# Patient Record
Sex: Female | Born: 2000 | Race: White | Hispanic: No | Marital: Single | Smoking: Never smoker
Health system: Southern US, Community
[De-identification: ages and names within clinical notes are randomized; demographics above are authoritative.]

## PROBLEM LIST (undated history)

## (undated) DIAGNOSIS — F32A Depression, unspecified: Secondary | ICD-10-CM

## (undated) HISTORY — PX: HERNIA REPAIR: SHX51

---
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
## 2023-05-07 HISTORY — PX: BREAST REDUCTION SURGERY: SHX8

## 2023-07-03 ENCOUNTER — Other Ambulatory Visit: Payer: Self-pay

## 2023-07-03 ENCOUNTER — Other Ambulatory Visit: Payer: Self-pay | Admitting: Orthopaedic Surgery

## 2023-07-03 ENCOUNTER — Encounter (HOSPITAL_BASED_OUTPATIENT_CLINIC_OR_DEPARTMENT_OTHER): Payer: Self-pay | Admitting: Orthopaedic Surgery

## 2023-07-03 DIAGNOSIS — S8261XA Displaced fracture of lateral malleolus of right fibula, initial encounter for closed fracture: Secondary | ICD-10-CM

## 2023-07-04 ENCOUNTER — Ambulatory Visit
Admission: RE | Admit: 2023-07-04 | Discharge: 2023-07-04 | Disposition: A | Discharge status: Home / Self Care | Payer: BC Managed Care – PPO | Source: Ambulatory Visit | Attending: Orthopaedic Surgery | Admitting: Orthopaedic Surgery

## 2023-07-04 DIAGNOSIS — S8261XA Displaced fracture of lateral malleolus of right fibula, initial encounter for closed fracture: Secondary | ICD-10-CM

## 2023-07-06 NOTE — H&P (Signed)
 ORTHOPAEDIC SURGERY H&P  Subjective:  The patient presents with right ankle fx.   Past Medical History:  Diagnosis Date   Depression     Past Surgical History:  Procedure Laterality Date   BREAST REDUCTION SURGERY Bilateral 05/07/2023   HERNIA REPAIR       (Not in an outpatient encounter)    Allergies  Allergen Reactions   Penicillins Rash    Social History   Socioeconomic History   Marital status: Single    Spouse name: Not on file   Number of children: Not on file   Years of education: Not on file   Highest education level: Not on file  Occupational History   Not on file  Tobacco Use   Smoking status: Never   Smokeless tobacco: Never  Vaping Use   Vaping status: Never Used  Substance and Sexual Activity   Alcohol use: Never   Drug use: Never   Sexual activity: Not on file  Other Topics Concern   Not on file  Social History Narrative   Not on file   Social Drivers of Health   Financial Resource Strain: Low Risk  (02/22/2020)   Received from [facility]   Overall Financial Resource Strain (CARDIA)    Difficulty of Paying Living Expenses: Not hard at all  Food Insecurity: Low Risk  (02/17/2023)   Received from [facility]   Hunger Vital Sign    Worried About Running Out of Food in the Last Year: Never true    Ran Out of Food in the Last Year: Never true  Transportation Needs: No Transportation Needs (02/17/2023)   Received from [facility]    In the past 12 months, has lack of reliable transportation kept you from medical appointments, meetings, work or from getting things needed for daily living? : No  Physical Activity: Inactive (02/22/2020)   Received from [facility]   Exercise Vital Sign    Days of Exercise per Week: 0 days    Minutes of Exercise per Session: 0 min  Stress: Stress Concern Present (02/22/2020)   Received from [facility] - Occupational Stress Questionnaire     Feeling of Stress : Very much  Social Connections: Unknown (02/22/2020)   Received from [facility]   Social Connection and Isolation Panel [NHANES]    Frequency of Communication with Friends and Family: More than three times a week    Frequency of Social Gatherings with Friends and Family: More than three times a week    Attends Religious Services: Not on file    Active Member of Clubs or Organizations: Not on file    Attends Banker Meetings: Not on file    Marital Status: Never married  Intimate Partner Violence: Not At Risk (07/01/2023)   Received from [facility]   HITS    Over the last 12 months how often did your partner physically hurt you?: Never    Over the last 12 months how often did your partner insult you or talk down to you?: Never    Over the last 12 months how often did your partner threaten you with physical harm?: Never    Over the last 12 months how often did your partner scream or curse at you?: Never     History reviewed. No pertinent family history.   Review of Systems Pertinent items are noted in HPI.  Objective: Vital signs in last 24 hours:  07/03/2023    3:18 PM 06/10/2019   11:50 PM  Vitals with BMI  Height 5\' 3"    Weight 135 lbs 135 lbs  BMI 23.92   Systolic  124  Diastolic  61  Pulse  97      EXAM: General: Well nourished, well developed. Awake, alert and oriented to time, place, person. Normal mood and affect. No apparent distress. Breathing room air.  Operative Lower Extremity: Alignment - Neutral Deformity - None Skin intact Tenderness to palpation - right ankle 5/5 TA, PT, GS, Per, EHL, FHL Sensation intact to light touch throughout Palpable DP and PT pulses Special testing: None  The contralateral foot/ankle was examined for comparison and noted to be neurovascularly intact with no localized deformity, swelling, or tenderness.  Imaging Review All images taken were independently reviewed by  me.  Assessment/Plan: The clinical and radiographic findings were reviewed and discussed at length with the patient.  The patient has right ankle fx.  We spoke at length about the natural course of these findings. We discussed nonoperative and operative treatment options in detail.  The risks and benefits were presented and reviewed. The risks due to hardware failure/irritation, new/persistent/recurrent infection, stiffness, nerve/vessel/tendon injury, nonunion/malunion of any fracture, wound healing issues, allograft usage, development of arthritis, failure of this surgery, possibility of external fixation in certain situations, possibility of delayed definitive surgery, need for further surgery, prolonged wound care including further soft tissue coverage procedures, thromboembolic events, anesthesia/medical complications/events perioperatively and beyond, amputation, death among others were discussed. The patient acknowledged the explanation and agreed to proceed with the plan.  Netta Cedars  Orthopaedic Surgery [facility]

## 2023-07-06 NOTE — Discharge Instructions (Signed)
 Netta Cedars, MD [facility]  Please read the following information regarding your care after surgery.  Medications  You only need a prescription for the narcotic pain medicine (ex. oxycodone, Percocet, Norco).  All of the other medicines listed below are available over the counter. ? Aleve 2 pills twice a day for the first 3 days after surgery. ? acetominophen (Tylenol) 650 mg every 4-6 hours as you need for minor to moderate pain ? oxycodone as prescribed for severe pain  ? To help prevent blood clots, take aspirin (81 mg) twice daily for at least 42 days after surgery.  You should also get up every hour while you are awake to move around.  Weight Bearing ? Do NOT bear any weight on the operated leg or foot. This means do NOT touch your surgical leg to the ground!  Cast / Splint / Dressing ? If you have a splint, do NOT remove this. Keep your splint, cast or dressing clean and dry.  Don't put anything (coat hanger, pencil, etc) down inside of it.  If it gets wet, call the office immediately to schedule an appointment for a cast change.  Swelling IMPORTANT: It is normal for you to have swelling where you had surgery. To reduce swelling and pain, keep at least 3 pillows under your leg so that your toes are above your nose and your heel is above the level of your hip.  It may be necessary to keep your foot or leg elevated for several weeks.  This is critical to helping your incisions heal and your pain to feel better.  Follow Up Call my office at [phone] when you are discharged from the hospital or surgery center to schedule an appointment to be seen 7-10 days after surgery.  Call my office at [phone] if you develop a fever >101.5 F, nausea, vomiting, bleeding from the surgical site or severe pain.      Post Anesthesia Home Care Instructions  Activity: Get plenty of rest for the remainder of the day. A responsible individual must stay with you for 24 hours following the  procedure.  For the next 24 hours, DO NOT: -Drive a car -Advertising copywriter -Drink alcoholic beverages -Take any medication unless instructed by your physician -Make any legal decisions or sign important papers.  Meals: Start with liquid foods such as gelatin or soup. Progress to regular foods as tolerated. Avoid greasy, spicy, heavy foods. If nausea and/or vomiting occur, drink only clear liquids until the nausea and/or vomiting subsides. Call your physician if vomiting continues.  Special Instructions/Symptoms: Your throat may feel dry or sore from the anesthesia or the breathing tube placed in your throat during surgery. If this causes discomfort, gargle with warm salt water. The discomfort should disappear within 24 hours.  If you had a scopolamine patch placed behind your ear for the management of post- operative nausea and/or vomiting:  1. The medication in the patch is effective for 72 hours, after which it should be removed.  Wrap patch in a tissue and discard in the trash. Wash hands thoroughly with soap and water. 2. You may remove the patch earlier than 72 hours if you experience unpleasant side effects which may include dry mouth, dizziness or visual disturbances. 3. Avoid touching the patch. Wash your hands with soap and water after contact with the patch.     Regional Anesthesia Blocks  1. You may not be able to move or feel the "blocked" extremity after a regional anesthetic block. This  may last may last from 3-48 hours after placement, but it will go away. The length of time depends on the medication injected and your individual response to the medication. As the nerves start to wake up, you may experience tingling as the movement and feeling returns to your extremity. If the numbness and inability to move your extremity has not gone away after 48 hours, please call your surgeon.   2. The extremity that is blocked will need to be protected until the numbness is gone and the  strength has returned. Because you cannot feel it, you will need to take extra care to avoid injury. Because it may be weak, you may have difficulty moving it or using it. You may not know what position it is in without looking at it while the block is in effect.  3. For blocks in the legs and feet, returning to weight bearing and walking needs to be done carefully. You will need to wait until the numbness is entirely gone and the strength has returned. You should be able to move your leg and foot normally before you try and bear weight or walk. You will need someone to be with you when you first try to ensure you do not fall and possibly risk injury.  4. Bruising and tenderness at the needle site are common side effects and will resolve in a few days.  5. Persistent numbness or new problems with movement should be communicated to the surgeon or the [facility] [phone] [facility] [phone]).  No tylenol until after 2:15pm if needed.

## 2023-07-09 ENCOUNTER — Ambulatory Visit (HOSPITAL_COMMUNITY)

## 2023-07-09 ENCOUNTER — Encounter (HOSPITAL_BASED_OUTPATIENT_CLINIC_OR_DEPARTMENT_OTHER)
Admission: RE | Disposition: A | Discharge status: Home / Self Care | Payer: Self-pay | Source: Home / Self Care | Attending: Orthopaedic Surgery

## 2023-07-09 ENCOUNTER — Ambulatory Visit (HOSPITAL_BASED_OUTPATIENT_CLINIC_OR_DEPARTMENT_OTHER)
Admission: RE | Admit: 2023-07-09 | Discharge: 2023-07-09 | Disposition: A | Discharge status: Home / Self Care | Payer: BC Managed Care – PPO | Attending: Orthopaedic Surgery | Admitting: Orthopaedic Surgery

## 2023-07-09 ENCOUNTER — Ambulatory Visit (HOSPITAL_BASED_OUTPATIENT_CLINIC_OR_DEPARTMENT_OTHER): Admitting: Anesthesiology

## 2023-07-09 ENCOUNTER — Other Ambulatory Visit: Payer: Self-pay

## 2023-07-09 ENCOUNTER — Encounter (HOSPITAL_BASED_OUTPATIENT_CLINIC_OR_DEPARTMENT_OTHER): Payer: Self-pay | Admitting: Orthopaedic Surgery

## 2023-07-09 DIAGNOSIS — F32A Depression, unspecified: Secondary | ICD-10-CM | POA: Insufficient documentation

## 2023-07-09 DIAGNOSIS — S82851A Displaced trimalleolar fracture of right lower leg, initial encounter for closed fracture: Secondary | ICD-10-CM | POA: Diagnosis present

## 2023-07-09 DIAGNOSIS — S93431A Sprain of tibiofibular ligament of right ankle, initial encounter: Secondary | ICD-10-CM | POA: Diagnosis not present

## 2023-07-09 DIAGNOSIS — X58XXXA Exposure to other specified factors, initial encounter: Secondary | ICD-10-CM | POA: Insufficient documentation

## 2023-07-09 DIAGNOSIS — Z01818 Encounter for other preprocedural examination: Secondary | ICD-10-CM

## 2023-07-09 HISTORY — PX: SYNDESMOSIS REPAIR: SHX5182

## 2023-07-09 HISTORY — DX: Depression, unspecified: F32.A

## 2023-07-09 HISTORY — PX: ORIF ANKLE FRACTURE: SHX5408

## 2023-07-09 LAB — POCT PREGNANCY, URINE: Preg Test, Ur: NEGATIVE

## 2023-07-09 SURGERY — OPEN REDUCTION INTERNAL FIXATION (ORIF) ANKLE FRACTURE
Anesthesia: Regional | Site: Ankle | Laterality: Right

## 2023-07-09 MED ORDER — FENTANYL CITRATE (PF) 100 MCG/2ML IJ SOLN
INTRAMUSCULAR | Status: AC
  Filled 2023-07-09: qty 2

## 2023-07-09 MED ORDER — VANCOMYCIN HCL 500 MG IV SOLR
INTRAVENOUS | Status: AC
  Filled 2023-07-09: qty 40

## 2023-07-09 MED ORDER — MIDAZOLAM HCL 2 MG/2ML IJ SOLN
INTRAMUSCULAR | Status: AC
  Filled 2023-07-09: qty 2

## 2023-07-09 MED ORDER — 0.9 % SODIUM CHLORIDE (POUR BTL) OPTIME
TOPICAL | Status: DC | PRN
  Administered 2023-07-09: 500 mL

## 2023-07-09 MED ORDER — AMISULPRIDE (ANTIEMETIC) 5 MG/2ML IV SOLN
INTRAVENOUS | Status: AC
  Filled 2023-07-09: qty 4

## 2023-07-09 MED ORDER — DEXAMETHASONE SODIUM PHOSPHATE 10 MG/ML IJ SOLN
INTRAMUSCULAR | Status: AC
  Filled 2023-07-09: qty 1

## 2023-07-09 MED ORDER — DEXAMETHASONE SODIUM PHOSPHATE 10 MG/ML IJ SOLN
INTRAMUSCULAR | Status: DC | PRN
  Administered 2023-07-09: 10 mg via INTRAVENOUS

## 2023-07-09 MED ORDER — OXYCODONE HCL 5 MG PO TABS: 5.0000 mg | ORAL_TABLET | Freq: Once | ORAL | Status: DC | PRN

## 2023-07-09 MED ORDER — PROPOFOL 10 MG/ML IV BOLUS
INTRAVENOUS | Status: DC | PRN
  Administered 2023-07-09: 300 mg via INTRAVENOUS

## 2023-07-09 MED ORDER — FENTANYL CITRATE (PF) 100 MCG/2ML IJ SOLN: 25.0000 ug | INTRAMUSCULAR | Status: DC | PRN

## 2023-07-09 MED ORDER — ONDANSETRON HCL 4 MG/2ML IJ SOLN
INTRAMUSCULAR | Status: DC | PRN
  Administered 2023-07-09: 4 mg via INTRAVENOUS

## 2023-07-09 MED ORDER — OXYCODONE HCL 5 MG/5ML PO SOLN: 5.0000 mg | Freq: Once | ORAL | Status: DC | PRN

## 2023-07-09 MED ORDER — LIDOCAINE 2% (20 MG/ML) 5 ML SYRINGE
INTRAMUSCULAR | Status: AC
  Filled 2023-07-09: qty 5

## 2023-07-09 MED ORDER — POVIDONE-IODINE 10 % EX SOLN
CUTANEOUS | Status: DC | PRN
  Administered 2023-07-09: 1 via TOPICAL

## 2023-07-09 MED ORDER — LACTATED RINGERS IV SOLN: INTRAVENOUS | Status: DC

## 2023-07-09 MED ORDER — AMISULPRIDE (ANTIEMETIC) 5 MG/2ML IV SOLN
10.0000 mg | Freq: Once | INTRAVENOUS | Status: AC | PRN
  Administered 2023-07-09: 10 mg via INTRAVENOUS

## 2023-07-09 MED ORDER — ACETAMINOPHEN 500 MG PO TABS
ORAL_TABLET | ORAL | Status: AC
  Filled 2023-07-09: qty 2

## 2023-07-09 MED ORDER — MIDAZOLAM HCL 5 MG/5ML IJ SOLN
INTRAMUSCULAR | Status: DC | PRN
  Administered 2023-07-09: 2 mg via INTRAVENOUS

## 2023-07-09 MED ORDER — CEFAZOLIN SODIUM-DEXTROSE 2-4 GM/100ML-% IV SOLN
INTRAVENOUS | Status: AC
  Filled 2023-07-09: qty 100

## 2023-07-09 MED ORDER — DEXAMETHASONE SODIUM PHOSPHATE 10 MG/ML IJ SOLN
INTRAMUSCULAR | Status: DC | PRN
  Administered 2023-07-09 (×2): 5 mg

## 2023-07-09 MED ORDER — ROPIVACAINE HCL 5 MG/ML IJ SOLN
INTRAMUSCULAR | Status: DC | PRN
  Administered 2023-07-09: 27 mL via PERINEURAL
  Administered 2023-07-09: 10 mL via PERINEURAL

## 2023-07-09 MED ORDER — VANCOMYCIN HCL 500 MG IV SOLR
INTRAVENOUS | Status: DC | PRN
  Administered 2023-07-09: 500 mg via TOPICAL

## 2023-07-09 MED ORDER — CHLORHEXIDINE GLUCONATE 4 % EX SOLN: 60.0000 mL | Freq: Once | CUTANEOUS | Status: DC

## 2023-07-09 MED ORDER — ONDANSETRON HCL 4 MG/2ML IJ SOLN
INTRAMUSCULAR | Status: AC
  Filled 2023-07-09: qty 2

## 2023-07-09 MED ORDER — FENTANYL CITRATE (PF) 100 MCG/2ML IJ SOLN
100.0000 ug | Freq: Once | INTRAMUSCULAR | Status: AC
  Administered 2023-07-09: 100 ug via INTRAVENOUS

## 2023-07-09 MED ORDER — ACETAMINOPHEN 500 MG PO TABS
1000.0000 mg | ORAL_TABLET | Freq: Once | ORAL | Status: AC
  Administered 2023-07-09: 1000 mg via ORAL

## 2023-07-09 MED ORDER — PROPOFOL 10 MG/ML IV BOLUS
INTRAVENOUS | Status: AC
  Filled 2023-07-09: qty 20

## 2023-07-09 MED ORDER — MIDAZOLAM HCL 2 MG/2ML IJ SOLN
2.0000 mg | Freq: Once | INTRAMUSCULAR | Status: AC
  Administered 2023-07-09: 2 mg via INTRAVENOUS

## 2023-07-09 MED ORDER — FENTANYL CITRATE (PF) 100 MCG/2ML IJ SOLN
INTRAMUSCULAR | Status: DC | PRN
  Administered 2023-07-09 (×2): 25 ug via INTRAVENOUS
  Administered 2023-07-09: 50 ug via INTRAVENOUS

## 2023-07-09 MED ORDER — LIDOCAINE 2% (20 MG/ML) 5 ML SYRINGE
INTRAMUSCULAR | Status: DC | PRN
  Administered 2023-07-09: 20 mg via INTRAVENOUS

## 2023-07-09 MED ORDER — BUPIVACAINE HCL (PF) 0.25 % IJ SOLN
INTRAMUSCULAR | Status: AC
  Filled 2023-07-09: qty 30

## 2023-07-09 MED ORDER — CEFAZOLIN SODIUM-DEXTROSE 2-4 GM/100ML-% IV SOLN
2.0000 g | INTRAVENOUS | Status: AC
  Administered 2023-07-09: 2 g via INTRAVENOUS

## 2023-07-09 SURGICAL SUPPLY — 68 items
BANDAGE ESMARK 6X9 LF (GAUZE/BANDAGES/DRESSINGS) IMPLANT
BIT DRILL 2.5X2.75 QC CALB (BIT) IMPLANT
BIT DRILL 2.9X70 QC CALB (BIT) IMPLANT
BLADE SURG 15 STRL LF DISP TIS (BLADE) ×12 IMPLANT
BNDG COHESIVE 4X5 TAN STRL LF (GAUZE/BANDAGES/DRESSINGS) ×3 IMPLANT
BNDG ELASTIC 6X10 VLCR STRL LF (GAUZE/BANDAGES/DRESSINGS) ×3 IMPLANT
BNDG ESMARK 6X9 LF (GAUZE/BANDAGES/DRESSINGS) IMPLANT
BNDG GAUZE DERMACEA FLUFF 4 (GAUZE/BANDAGES/DRESSINGS) ×3 IMPLANT
BRUSH SCRUB EZ 4% CHG (MISCELLANEOUS) ×3 IMPLANT
CANISTER SUCT 1200ML W/VALVE (MISCELLANEOUS) ×3 IMPLANT
CHLORAPREP W/TINT 26 (MISCELLANEOUS) ×3 IMPLANT
COVER BACK TABLE 60X90IN (DRAPES) ×3 IMPLANT
CUFF TRNQT CYL 34X4.125X (TOURNIQUET CUFF) ×3 IMPLANT
DRAPE C-ARM 42X72 X-RAY (DRAPES) ×3 IMPLANT
DRAPE C-ARMOR (DRAPES) ×3 IMPLANT
DRAPE EXTREMITY T 121X128X90 (DISPOSABLE) ×3 IMPLANT
DRAPE IMP U-DRAPE 54X76 (DRAPES) ×3 IMPLANT
DRAPE U-SHAPE 47X51 STRL (DRAPES) ×3 IMPLANT
DRSG MEPITEL 4X7.2 (GAUZE/BANDAGES/DRESSINGS) ×3 IMPLANT
ELECT REM PT RETURN 9FT ADLT (ELECTROSURGICAL) ×2 IMPLANT
ELECTRODE REM PT RTRN 9FT ADLT (ELECTROSURGICAL) ×3 IMPLANT
FIXATION ZIPTIGHT ANKLE SNDSMS (Ankle) IMPLANT
GAUZE PAD ABD 8X10 STRL (GAUZE/BANDAGES/DRESSINGS) ×9 IMPLANT
GAUZE SPONGE 4X4 12PLY STRL (GAUZE/BANDAGES/DRESSINGS) ×3 IMPLANT
GLOVE BIOGEL PI IND STRL 8 (GLOVE) ×3 IMPLANT
GLOVE SURG SS PI 7.5 STRL IVOR (GLOVE) ×6 IMPLANT
GOWN STRL REUS W/ TWL LRG LVL3 (GOWN DISPOSABLE) ×6 IMPLANT
K-WIRE ACE 1.6X6 (WIRE) ×6 IMPLANT
KWIRE ACE 1.6X6 (WIRE) IMPLANT
MARKER SKIN DUAL TIP RULER LAB (MISCELLANEOUS) IMPLANT
NDL HYPO 25X1 1.5 SAFETY (NEEDLE) IMPLANT
NEEDLE HYPO 25X1 1.5 SAFETY (NEEDLE) IMPLANT
NS IRRIG 1000ML POUR BTL (IV SOLUTION) ×3 IMPLANT
PACK BASIN DAY SURGERY FS (CUSTOM PROCEDURE TRAY) ×3 IMPLANT
PAD CAST 4YDX4 CTTN HI CHSV (CAST SUPPLIES) ×3 IMPLANT
PADDING CAST ABS COTTON 4X4 ST (CAST SUPPLIES) IMPLANT
PADDING CAST SYNTHETIC 4X4 STR (CAST SUPPLIES) ×6 IMPLANT
PADDING CAST SYNTHETIC 6X4 NS (CAST SUPPLIES) ×6 IMPLANT
PENCIL SMOKE EVACUATOR (MISCELLANEOUS) ×3 IMPLANT
PLATE LOCK 7H 92 BILAT FIB (Plate) IMPLANT
SCREW LOCK 3.5X12 DIST TIB (Screw) IMPLANT
SCREW LOCK CORT STAR 3.5X12 (Screw) IMPLANT
SCREW LOCK CORT STAR 3.5X14 (Screw) IMPLANT
SCREW NLOCK CANC HEX 4X55 (Screw) IMPLANT
SCREW NON LOCKING LP 3.5 14MM (Screw) IMPLANT
SCREW NON LOCKING LP 3.5 16MM (Screw) IMPLANT
SHEET MEDIUM DRAPE 40X70 STRL (DRAPES) ×3 IMPLANT
SLEEVE SCD COMPRESS KNEE MED (STOCKING) ×3 IMPLANT
SPIKE FLUID TRANSFER (MISCELLANEOUS) IMPLANT
SPLINT FIBERGLASS 4X30 (CAST SUPPLIES) IMPLANT
SPONGE T-LAP 18X18 ~~LOC~~+RFID (SPONGE) ×3 IMPLANT
STAPLER SKIN PROX WIDE 3.9 (STAPLE) IMPLANT
STOCKINETTE 6 STRL (DRAPES) ×3 IMPLANT
STOCKINETTE ORTHO 6X25 (MISCELLANEOUS) ×3 IMPLANT
SUCTION TUBE FRAZIER 10FR DISP (SUCTIONS) IMPLANT
SUT ETHILON 2 0 FS 18 (SUTURE) ×6 IMPLANT
SUT MNCRL AB 3-0 PS2 18 (SUTURE) IMPLANT
SUT PDS 3-0 CT2 (SUTURE) IMPLANT
SUT PDS II 3-0 CT2 27 ABS (SUTURE) IMPLANT
SUT VIC AB 0 CT1 27XBRD ANBCTR (SUTURE) IMPLANT
SUT VIC AB 2-0 CT1 TAPERPNT 27 (SUTURE) ×3 IMPLANT
SUT VIC AB 3-0 SH 27X BRD (SUTURE) IMPLANT
SYR BULB IRRIG 60ML STRL (SYRINGE) ×3 IMPLANT
SYR CONTROL 10ML LL (SYRINGE) IMPLANT
TOWEL GREEN STERILE FF (TOWEL DISPOSABLE) ×6 IMPLANT
TUBE CONNECTING 20X1/4 (TUBING) ×3 IMPLANT
UNDERPAD 30X36 HEAVY ABSORB (UNDERPADS AND DIAPERS) ×3 IMPLANT
ZIPTIGHT ANKLE SYNODESMOSS FIX (Ankle) ×2 IMPLANT

## 2023-07-09 NOTE — Anesthesia Procedure Notes (Signed)
 Anesthesia Regional Block: Popliteal block   Pre-Anesthetic Checklist: , timeout performed,  Correct Patient, Correct Site, Correct Laterality,  Correct Procedure, Correct Position, site marked,  Risks and benefits discussed,  Surgical consent,  Pre-op evaluation,  At surgeon's request and post-op pain management  Laterality: Right  Prep: Maximum Sterile Barrier Precautions used, chloraprep       Needles:  Injection technique: Single-shot  Needle Type: Echogenic Stimulator Needle     Needle Length: 9cm  Needle Gauge: 22     Additional Needles:   Procedures:,,,, ultrasound used (permanent image in chart),,    Narrative:  Start time: 07/09/2023 9:15 AM End time: 07/09/2023 9:18 AM Injection made incrementally with aspirations every 5 mL.  Performed by: Personally  Anesthesiologist: Elmer Picker, MD  Additional Notes: Monitors applied. No increased pain on injection. No increased resistance to injection. Injection made in 5cc increments. Good needle visualization. Patient tolerated procedure well.

## 2023-07-09 NOTE — Transfer of Care (Signed)
 Immediate Anesthesia Transfer of Care Note  Patient: Patricia Bowman  Procedure(s) Performed: OPEN REDUCTION INTERNAL FIXATION (ORIF) BIMALLEOLAR ANKLE FRACTURE (Right: Ankle) REPAIR, SYNDESMOSIS, ANKLE (Right)  Patient Location: PACU  Anesthesia Type:General and Regional  Level of Consciousness: drowsy  Airway & Oxygen Therapy: Patient Spontanous Breathing and Patient connected to face mask oxygen  Post-op Assessment: Report given to RN and Post -op Vital signs reviewed and stable  Post vital signs: Reviewed and stable  Last Vitals:  Vitals Value Taken Time  BP 129/64 07/09/23 1215  Temp    Pulse 99 07/09/23 1217  Resp 14 07/09/23 1217  SpO2 100 % 07/09/23 1217  Vitals shown include unfiled device data.  Last Pain:  Vitals:   07/09/23 0813  TempSrc: Temporal  PainSc: 2       Patients Stated Pain Goal: 1 (07/09/23 0813)  Complications: No notable events documented.

## 2023-07-09 NOTE — Progress Notes (Signed)
Assisted Dr. Lanetta Inch with right, adductor canal, popliteal, ultrasound guided block. Side rails up, monitors on throughout procedure. See vital signs in flow sheet. Tolerated Procedure well.

## 2023-07-09 NOTE — Anesthesia Procedure Notes (Signed)
 Procedure Name: LMA Insertion Date/Time: 07/09/2023 11:06 AM  Performed by: Roosvelt Harps, CRNAPre-anesthesia Checklist: Patient identified, Emergency Drugs available, Suction available and Patient being monitored Patient Re-evaluated:Patient Re-evaluated prior to induction Oxygen Delivery Method: Circle System Utilized Preoxygenation: Pre-oxygenation with 100% oxygen Induction Type: IV induction Ventilation: Mask ventilation without difficulty LMA: LMA inserted LMA Size: 4.0 Number of attempts: 1 Airway Equipment and Method: Bite block Placement Confirmation: positive ETCO2 Tube secured with: Tape Dental Injury: Teeth and Oropharynx as per pre-operative assessment

## 2023-07-09 NOTE — Anesthesia Procedure Notes (Signed)
 Anesthesia Regional Block: Adductor canal block   Pre-Anesthetic Checklist: , timeout performed,  Correct Patient, Correct Site, Correct Laterality,  Correct Procedure, Correct Position, site marked,  Risks and benefits discussed,  Pre-op evaluation,  At surgeon's request and post-op pain management  Laterality: Right  Prep: Maximum Sterile Barrier Precautions used, chloraprep       Needles:  Injection technique: Single-shot  Needle Type: Echogenic Stimulator Needle     Needle Length: 9cm  Needle Gauge: 21     Additional Needles:   Procedures:,,,, ultrasound used (permanent image in chart),,    Narrative:  Start time: 07/09/2023 9:18 AM End time: 07/09/2023 9:22 AM Injection made incrementally with aspirations every 5 mL. Anesthesiologist: Elmer Picker, MD

## 2023-07-09 NOTE — H&P (Signed)

## 2023-07-09 NOTE — Anesthesia Preprocedure Evaluation (Addendum)
 Anesthesia Evaluation  Patient identified by MRN, date of birth, ID band Patient awake    Reviewed: Allergy & Precautions, NPO status , Patient's Chart, lab work & pertinent test results  Airway Mallampati: III  TM Distance: >3 FB Neck ROM: Full    Dental no notable dental hx. (+) Teeth Intact, Dental Advisory Given   Pulmonary neg pulmonary ROS   Pulmonary exam normal breath sounds clear to auscultation       Cardiovascular negative cardio ROS Normal cardiovascular exam Rhythm:Regular Rate:Normal     Neuro/Psych  PSYCHIATRIC DISORDERS  Depression    negative neurological ROS     GI/Hepatic negative GI ROS, Neg liver ROS,,,  Endo/Other  negative endocrine ROS    Renal/GU negative Renal ROS  negative genitourinary   Musculoskeletal negative musculoskeletal ROS (+)    Abdominal   Peds  Hematology negative hematology ROS (+)   Anesthesia Other Findings   Reproductive/Obstetrics                             Anesthesia Physical Anesthesia Plan  ASA: 2  Anesthesia Plan: General and Regional   Post-op Pain Management: Regional block* and Tylenol PO (pre-op)*   Induction: Intravenous  PONV Risk Score and Plan: 3 and Ondansetron, Dexamethasone and Midazolam  Airway Management Planned: LMA  Additional Equipment:   Intra-op Plan:   Post-operative Plan: Extubation in OR  Informed Consent: I have reviewed the patients History and Physical, chart, labs and discussed the procedure including the risks, benefits and alternatives for the proposed anesthesia with the patient or authorized representative who has indicated his/her understanding and acceptance.     Dental advisory given  Plan Discussed with: CRNA  Anesthesia Plan Comments:        Anesthesia Quick Evaluation

## 2023-07-09 NOTE — Op Note (Signed)
 07/09/2023  4:09 PM   PATIENT: Patricia Bowman  23 y.o. female  MRN: [id number]   PRE-OPERATIVE DIAGNOSIS:   Closed displaced right trimalleolar ankle fracture   POST-OPERATIVE DIAGNOSIS:   Same   PROCEDURE: 1] ORIF right trimalleolar ankle fracture without internal fixation of posterior malleolus 2] ORIF right ankle syndesmosis   SURGEON:  Netta Cedars, MD   ASSISTANT: None   ANESTHESIA: General, regional   EBL: Minimal   TOURNIQUET:    Total Tourniquet Time Documented: Thigh (Right) - 45 minutes Total: Thigh (Right) - 45 minutes    COMPLICATIONS: None apparent   DISPOSITION: Extubated, awake and stable to recovery.   INDICATION FOR PROCEDURE: The patient presented with above diagnosis.  We discussed the diagnosis, alternative treatment options, risks and benefits of the above surgical intervention, as well as alternative non-operative treatments. All questions/concerns were addressed and the patient/family demonstrated appropriate understanding of the diagnosis, the procedure, the postoperative course, and overall prognosis. The patient wished to proceed with surgical intervention and signed an informed surgical consent as such, in each others presence prior to surgery.   PROCEDURE IN DETAIL: After preoperative consent was obtained and the correct operative site was identified, the patient was brought to the operating room supine on stretcher and transferred onto operating table. General anesthesia was induced. Preoperative antibiotics were administered. Surgical timeout was taken. The patient was then positioned supine with an ipsilateral hip bump. The operative lower extremity was prepped and draped in standard sterile fashion with a tourniquet around the thigh. The extremity was exsanguinated and the tourniquet was inflated to 275 mmHg.  A standard lateral incision was made over the distal fibula. Dissection was carried down to the level of the fibula  and the fracture site identified. The superficial peroneal nerve was identified and protected throughout the procedure. The fibula was noted to be shortened with interposed periosteum. The fibula was brought out to length. The fibula fracture was debrided and the edges defined to achieve cortical read. Reduction maneuver was performed using pointed reduction forceps and lobster forceps. In this manner, the fibula length was restored and fracture reduced. A lag screw was not placed given the orientation of fracture lines and comminution. Due to poor bone quality and extensive comminution at the fracture site, it was decided to use a locking distal fibula plate. We then selected a Zimmer locking plate to match the anatomy of the distal fibula and placed it laterally. This was implanted under intraoperative fluoroscopy with a combination of distal locking screws and proximal cortical & locking screws.  The distal tibia anterolateral fracture was evaluated directly and using intraoperative fluoroscopy. It was deemed too small and comminuted to receive internal fixation. Therefore, it was directly reduced and left in anatomic position to heal.   Similarly, the posterior malleolus fracture was evaluated using intraoperative fluoroscopy. It was also deemed too small to receive internal fixation. Therefore, it was in directly reduced and left in anatomic position to heal.   A manual external rotation stress radiograph was obtained and demonstrated widening of the ankle mortise. Given this intraoperative finding as well as preoperative subluxation, it was decided to reduce and fix the syndesmosis. Therefore a suture fixation system (ZipTight device) was implanted through the fibula plate in cannulated fashion to fix the syndesmosis. Anchor/button position was verified along anteromedial tibial cortex by fluoroscopy. A repeat stress radiograph showed complete stability of the ankle mortise to testing.  To further  reinforce the syndesmosis fixation, a nonlocking quadricortical hex head  4.0 mm screw was implanted through the fibula plate in appropriate fashion to fix the syndesmosis. Screw position was verified along anteromedial tibial cortex by fluoroscopy. A repeat stress radiograph showed continued stability of the ankle mortise to testing.  The surgical sites were thoroughly irrigated. The tourniquet was deflated and hemostasis achieved. Betadine and vancomycin powder were applied. The deep layers were closed using 2-0 vicryl. The skin was closed without tension.    The leg was cleaned with saline and sterile dressings with gauze were applied. A well padded bulky short leg splint was applied. The patient was awakened from anesthesia and transported to the recovery room in stable condition.    FOLLOW UP PLAN: -transfer to PACU, then home -strict NWB operative extremity, maximum elevation -maintain short leg splint until follow up -DVT ppx: Aspirin 81 mg twice daily while NWB -follow up as outpatient within 7-10 days for wound check with exchange of short leg splint to short leg cast -sutures out in 2-3 weeks in outpatient office   RADIOGRAPHS: AP, lateral, oblique and stress radiographs of the right ankle were obtained intraoperatively. These showed interval reduction and fixation of the fractures. Manual stress radiographs were taken and the joints were noted to be stable following fixation. All hardware is appropriately positioned and of the appropriate lengths. No other acute injuries are noted.   Netta Cedars Orthopaedic Surgery [facility]

## 2023-07-09 NOTE — Anesthesia Postprocedure Evaluation (Signed)
 Anesthesia Post Note  Patient: Patricia Bowman  Procedure(s) Performed: OPEN REDUCTION INTERNAL FIXATION (ORIF) BIMALLEOLAR ANKLE FRACTURE (Right: Ankle) REPAIR, SYNDESMOSIS, ANKLE (Right)     Patient location during evaluation: PACU Anesthesia Type: Regional and General Level of consciousness: awake and alert Pain management: pain level controlled Vital Signs Assessment: post-procedure vital signs reviewed and stable Respiratory status: spontaneous breathing, nonlabored ventilation, respiratory function stable and patient connected to nasal cannula oxygen Cardiovascular status: blood pressure returned to baseline and stable Postop Assessment: no apparent nausea or vomiting Anesthetic complications: no  No notable events documented.  Last Vitals:  Vitals:   07/09/23 1230 07/09/23 1305  BP: 125/65 110/72  Pulse: 90 83  Resp: 14 16  Temp:  36.5 C  SpO2: 98% 96%    Last Pain:  Vitals:   07/09/23 1305  TempSrc: Temporal  PainSc: 2                  Joeanna Howdyshell L Shatyra Becka

## 2023-07-10 ENCOUNTER — Encounter (HOSPITAL_BASED_OUTPATIENT_CLINIC_OR_DEPARTMENT_OTHER): Payer: Self-pay | Admitting: Orthopaedic Surgery

## 2023-09-29 ENCOUNTER — Ambulatory Visit
Admission: EM | Admit: 2023-09-29 | Discharge: 2023-09-29 | Disposition: A | Discharge status: Home / Self Care | Attending: Family Medicine | Admitting: Family Medicine

## 2023-09-29 DIAGNOSIS — J020 Streptococcal pharyngitis: Secondary | ICD-10-CM

## 2023-09-29 LAB — POCT RAPID STREP A (OFFICE): Rapid Strep A Screen: POSITIVE — AB

## 2023-09-29 MED ORDER — CLINDAMYCIN HCL 300 MG PO CAPS
300.0000 mg | ORAL_CAPSULE | Freq: Three times a day (TID) | ORAL | 0 refills | Status: AC
Start: 2023-09-29 — End: 2023-10-09

## 2023-09-29 NOTE — ED Provider Notes (Signed)
 [facility]    CSN: [id number] Arrival date & time: 09/29/23  1158      History   Chief Complaint Chief Complaint  Patient presents with   Sore Throat    HPI Patricia Bowman is a 23 y.o. female  presents for evaluation of URI symptoms for 3 days. Patient reports associated symptoms of sore throat and congestion. Denies N/V/D, cough, fevers, ear pain, body aches, shortness of breath. Patient does not have a hx of asthma. Patient is not an active smoker.   States was treated for strep throat 3 weeks ago with cefdinir.  She states she was only given 7 days worth and completed about 4 and stopped taking it as her symptoms resolved.  Pt has taken Tylenol  and Motrin OTC for symptoms.  Denies pregnancy or breast-feeding.  Pt has no other concerns at this time.    Sore Throat    Past Medical History:  Diagnosis Date   Depression     There are no active problems to display for this patient.   Past Surgical History:  Procedure Laterality Date   BREAST REDUCTION SURGERY Bilateral 05/07/2023   HERNIA REPAIR     ORIF ANKLE FRACTURE Right 07/09/2023   Procedure: OPEN REDUCTION INTERNAL FIXATION (ORIF) BIMALLEOLAR ANKLE FRACTURE;  Surgeon: Ali Ink, MD;  Location: [facility];  Service: Orthopedics;  Laterality: Right;   SYNDESMOSIS REPAIR Right 07/09/2023   Procedure: REPAIR, SYNDESMOSIS, ANKLE;  Surgeon: Ali Ink, MD;  Location: [facility];  Service: Orthopedics;  Laterality: Right;    OB History   No obstetric history on file.      Home Medications    Prior to Admission medications   Medication Sig Start Date End Date Taking? Authorizing Provider  clindamycin (CLEOCIN) 300 MG capsule Take 1 capsule (300 mg total) by mouth 3 (three) times daily for 10 days. 09/29/23 10/09/23 Yes Johnsie Moscoso, Jodi R, NP  lamoTRIgine (LAMICTAL) 100 MG tablet Take 100 mg by mouth daily.    [provider]  lurasidone (LATUDA) 20 MG TABS  tablet Take by mouth.    [provider]    Family History History reviewed. No pertinent family history.  Social History Social History   Tobacco Use   Smoking status: Never   Smokeless tobacco: Never  Vaping Use   Vaping status: Never Used  Substance Use Topics   Alcohol use: Never   Drug use: Never     Allergies   Penicillins   Review of Systems Review of Systems  HENT:  Positive for congestion and sore throat.      Physical Exam Triage Vital Signs ED Triage Vitals  Encounter Vitals Group     BP 09/29/23 1233 107/68     Systolic BP Percentile --      Diastolic BP Percentile --      Pulse Rate 09/29/23 1233 91     Resp 09/29/23 1233 17     Temp 09/29/23 1233 99.2 F (37.3 C)     Temp Source 09/29/23 1233 Oral     SpO2 09/29/23 1233 97 %     Weight --      Height --      Head Circumference --      Peak Flow --      Pain Score 09/29/23 1229 6     Pain Loc --      Pain Education --      Exclude from Growth Chart --  No data found.  Updated Vital Signs BP 107/68 (BP Location: Right Arm)   Pulse 91   Temp 99.2 F (37.3 C) (Oral)   Resp 17   LMP 09/01/2023 (Exact Date)   SpO2 97%   Visual Acuity Right Eye Distance:   Left Eye Distance:   Bilateral Distance:    Right Eye Near:   Left Eye Near:    Bilateral Near:     Physical Exam Vitals and nursing note reviewed.  Constitutional:      General: She is not in acute distress.    Appearance: She is well-developed. She is not ill-appearing.  HENT:     Head: Normocephalic and atraumatic.     Right Ear: Tympanic membrane and ear canal normal.     Left Ear: Tympanic membrane and ear canal normal.     Nose: No congestion or rhinorrhea.     Mouth/Throat:     Mouth: Mucous membranes are moist.     Pharynx: Oropharynx is clear. Uvula midline. Posterior oropharyngeal erythema present. No pharyngeal swelling or uvula swelling.     Tonsils: No tonsillar exudate or tonsillar abscesses. 3+ on  the right. 3+ on the left.  Eyes:     Conjunctiva/sclera: Conjunctivae normal.     Pupils: Pupils are equal, round, and reactive to light.  Cardiovascular:     Rate and Rhythm: Normal rate and regular rhythm.     Heart sounds: Normal heart sounds.  Pulmonary:     Effort: Pulmonary effort is normal.     Breath sounds: Normal breath sounds.  Musculoskeletal:     Cervical back: Normal range of motion and neck supple.  Lymphadenopathy:     Cervical: Cervical adenopathy present.  Skin:    General: Skin is warm and dry.  Neurological:     General: No focal deficit present.     Mental Status: She is alert and oriented to person, place, and time.  Psychiatric:        Mood and Affect: Mood normal.        Behavior: Behavior normal.      UC Treatments / Results  Labs (all labs ordered are listed, but only abnormal results are displayed) Labs Reviewed  POCT RAPID STREP A (OFFICE) - Abnormal; Notable for the following components:      Result Value   Rapid Strep A Screen Positive (*)    All other components within normal limits    EKG   Radiology No results found.  Procedures Procedures (including critical care time)  Medications Ordered in UC Medications - No data to display  Initial Impression / Assessment and Plan / UC Course  I have reviewed the triage vital signs and the nursing notes.  Pertinent labs & imaging results that were available during my care of the patient were reviewed by me and considered in my medical decision making (see chart for details).     Reviewed exam and symptoms with patient.  No red flags.  Positive rapid strep.  Will start clindamycin 3 times daily for 10 days.  Patient instructed to complete all the antibiotics and she verbalized understanding.  Advised OTC probiotics and/or yogurt.  Discussed continuation of OTC analgesics, use of salt water gargles and warm liquids.  PCP follow-up if symptoms do not improve.  ER precautions reviewed. Final  Clinical Impressions(s) / UC Diagnoses   Final diagnoses:  Streptococcal sore throat     Discharge Instructions      Start clindamycin 3 times a  day for 10 days.  Take a probiotic with this medication as it can cause diarrhea.  You may use salt water gargles and warm liquids such as teas and honey.  Over-the-counter Tylenol  or ibuprofen as needed.  Please follow-up with your PCP if your symptoms do not improve.  Please go to the ER for any worsening symptoms.  Hope you feel better soon!  ED Prescriptions     Medication Sig Dispense Auth. Provider   clindamycin (CLEOCIN) 300 MG capsule Take 1 capsule (300 mg total) by mouth 3 (three) times daily for 10 days. 30 capsule Makayah Pauli, Jodi R, NP      PDMP not reviewed this encounter.   Alleen Arbour, NP 09/29/23 [phone]

## 2023-09-29 NOTE — Discharge Instructions (Addendum)
 Start clindamycin 3 times a day for 10 days.  Take a probiotic with this medication as it can cause diarrhea.  You may use salt water gargles and warm liquids such as teas and honey.  Over-the-counter Tylenol  or ibuprofen as needed.  Please follow-up with your PCP if your symptoms do not improve.  Please go to the ER for any worsening symptoms.  Hope you feel better soon!

## 2023-09-29 NOTE — ED Triage Notes (Signed)
 Pt present with c/o sore throat x three days. Pt had strep 3 wks ago and states the sore throat has comeback.   Pt has a headache and body aches.  Home interventions: Tylenol  and motrin

## 2024-04-14 ENCOUNTER — Encounter (HOSPITAL_BASED_OUTPATIENT_CLINIC_OR_DEPARTMENT_OTHER): Payer: Self-pay | Admitting: Orthopaedic Surgery

## 2024-04-20 NOTE — H&P (Signed)
 ORTHOPAEDIC SURGERY H&P  Subjective:  The patient presents for removal of right deep ankle hardware (syndesmosis fixation), possible revision syndesmosis fixation.   Past Medical History:  Diagnosis Date   Depression     Past Surgical History:  Procedure Laterality Date   BREAST REDUCTION SURGERY Bilateral 05/07/2023   HERNIA REPAIR     ORIF ANKLE FRACTURE Right 07/09/2023   Procedure: OPEN REDUCTION INTERNAL FIXATION (ORIF) BIMALLEOLAR ANKLE FRACTURE;  Surgeon: Barton Drape, MD;  Location: [facility];  Service: Orthopedics;  Laterality: Right;   SYNDESMOSIS REPAIR Right 07/09/2023   Procedure: REPAIR, SYNDESMOSIS, ANKLE;  Surgeon: Barton Drape, MD;  Location: [facility];  Service: Orthopedics;  Laterality: Right;     Show/hide medication list[1]   Allergies[2]  Social History   Socioeconomic History   Marital status: Single    Spouse name: Not on file   Number of children: Not on file   Years of education: Not on file   Highest education level: Not on file  Occupational History   Not on file  Tobacco Use   Smoking status: Never   Smokeless tobacco: Never  Vaping Use   Vaping status: Never Used  Substance and Sexual Activity   Alcohol use: Yes    Comment: occ   Drug use: Never   Sexual activity: Not on file  Other Topics Concern   Not on file  Social History Narrative   Not on file   Social Drivers of Health   Tobacco Use: Low Risk (04/14/2024)   Patient History    Smoking Tobacco Use: Never    Smokeless Tobacco Use: Never    Passive Exposure: Not on file  Financial Resource Strain: Not on file  Food Insecurity: Low Risk (02/17/2023)   Received from [facility]   Epic    Within the past 12 months, you worried that your food would run out before you got money to buy more: Never true    Within the past 12 months, the food you bought just didn't last and you didn't have money to get more. : Never true  Transportation  Needs: No Transportation Needs (02/17/2023)   Received from [facility]    In the past 12 months, has lack of reliable transportation kept you from medical appointments, meetings, work or from getting things needed for daily living? : No  Physical Activity: Not on file  Stress: Not on file  Social Connections: Not on file  Intimate Partner Violence: Not At Risk (07/01/2023)   Received from [facility]   HITS    Over the last 12 months how often did your partner physically hurt you?: Never    Over the last 12 months how often did your partner insult you or talk down to you?: Never    Over the last 12 months how often did your partner threaten you with physical harm?: Never    Over the last 12 months how often did your partner scream or curse at you?: Never  Depression (PHQ2-9): Not on file  Alcohol Screen: Not on file  Housing: Low Risk (02/17/2023)   Received from [facility]   Epic    What is your living situation today?: I have a steady place to live    Think about the place you live. Do you have problems with any of the following? Choose all that apply:: None/None on this list  Utilities: Low Risk (02/17/2023)   Received from [facility]  Utilities    In the past 12 months has the electric, gas, oil, or water company threatened to shut off services in your home? : No  Health Literacy: Not on file     History reviewed. No pertinent family history.   Review of Systems Pertinent items are noted in HPI.  Objective: Vital signs in last 24 hours:    04/14/2024    2:36 PM 09/29/2023   12:33 PM 07/09/2023    1:05 PM  Vitals with BMI  Height 5' 3    Weight 135 lbs    BMI 23.92    Systolic  107 110  Diastolic  68 72  Pulse  91 83      EXAM: General: Well nourished, well developed. Awake, alert and oriented to time, place, person. Normal mood and affect. No apparent distress. Breathing room air.  Operative Lower Extremity: Alignment -  Neutral Deformity - None Skin intact Tenderness to palpation - none 5/5 TA, PT, GS, Per, EHL, FHL Sensation intact to light touch throughout Palpable DP and PT pulses Special testing: None  The contralateral foot/ankle was examined for comparison and noted to be neurovascularly intact with no localized deformity, swelling, or tenderness.  Imaging Review All images taken were independently reviewed by me.  Assessment/Plan: The clinical and radiographic findings were reviewed and discussed at length with the patient.  The patient presents for removal of right deep ankle hardware (syndesmosis fixation), possible revision syndesmosis fixation.  We spoke at length about the natural course of these findings. We discussed nonoperative and operative treatment options in detail.  The risks and benefits were presented and reviewed. The risks due to hardware/suture failure and/or irritation (if removing hardware: inability to remove part/all of hardware, recurrent instability), new/persistent infection, stiffness, nerve/vessel/tendon injury or rerupture of repaired tendon, nonunion/malunion, allograft usage, wound healing issues, development of arthritis, failure of this surgery, possibility of external fixation with delayed definitive surgery, need for further surgery, thromboembolic events, anesthesia/medical complications, amputation, death among others were discussed.  Patricia Bowman  Orthopaedic Surgery [facility]     [1] (Not in an outpatient encounter) [2]  Allergies Allergen Reactions   Penicillins Rash

## 2024-04-20 NOTE — Discharge Instructions (Signed)
 Lillia Mountain, MD [facility]  Please read the following information regarding your care after surgery.  Medications   - Oxycodone  5 mg every 6 hours as needed for pain - Aspirin 81 mg twice daily as scheduled to prevent blood clots - Colace 100 mg twice daily as needed for constipation - Zofran  4 mg every 8 hours as needed for nausea/vomiting  We send above prescriptions to your pharmacy on file.  In addition you may also use: ? acetaminophen  (Tylenol ) 500 mg every 4-6 hours as you need for minor to moderate pain  Resume all other routine medications per usual or as directed by your PCP/other specialists.  ? To help prevent blood clots, you should also get up every hour while you are awake to move around.  Weight Bearing ? OK to walk on the operative leg only AFTER the nerve block has completely worn off.  Cast / Splint / Dressing ? Keep your dressing clean and dry.  Don't put anything (coat hanger, pencil, etc) down inside of it.  If it gets wet, please notify the office immediately.  Swelling IMPORTANT: It is normal for you to have swelling where you had surgery. To reduce swelling and pain, keep at least 3 pillows under your leg so that your toes are above your nose and your heel is above the level of your hip.  It may be necessary to keep your foot or leg elevated for several weeks.  This is critical to helping your incisions heal and your pain to feel better.  Follow Up Call my office at [phone] when you are discharged from the hospital or surgery center to schedule an appointment to be seen within 7-10 days after surgery.  Call my office at [phone] if you develop a fever >101.5 F, nausea, vomiting, bleeding from the surgical site or severe pain.

## 2024-04-21 ENCOUNTER — Encounter (HOSPITAL_BASED_OUTPATIENT_CLINIC_OR_DEPARTMENT_OTHER): Payer: Self-pay | Admitting: Anesthesiology

## 2024-04-21 ENCOUNTER — Encounter (HOSPITAL_BASED_OUTPATIENT_CLINIC_OR_DEPARTMENT_OTHER): Admission: RE | Disposition: A | Payer: Self-pay | Source: Home / Self Care | Attending: Orthopaedic Surgery

## 2024-04-21 ENCOUNTER — Other Ambulatory Visit: Payer: Self-pay

## 2024-04-21 ENCOUNTER — Ambulatory Visit (HOSPITAL_BASED_OUTPATIENT_CLINIC_OR_DEPARTMENT_OTHER)

## 2024-04-21 ENCOUNTER — Ambulatory Visit (HOSPITAL_BASED_OUTPATIENT_CLINIC_OR_DEPARTMENT_OTHER): Payer: Self-pay | Admitting: Anesthesiology

## 2024-04-21 ENCOUNTER — Encounter (HOSPITAL_BASED_OUTPATIENT_CLINIC_OR_DEPARTMENT_OTHER): Payer: Self-pay | Admitting: Orthopaedic Surgery

## 2024-04-21 ENCOUNTER — Ambulatory Visit (HOSPITAL_BASED_OUTPATIENT_CLINIC_OR_DEPARTMENT_OTHER)
Admission: RE | Admit: 2024-04-21 | Discharge: 2024-04-21 | Disposition: A | Attending: Orthopaedic Surgery | Admitting: Orthopaedic Surgery

## 2024-04-21 DIAGNOSIS — Z4589 Encounter for adjustment and management of other implanted devices: Secondary | ICD-10-CM | POA: Diagnosis present

## 2024-04-21 DIAGNOSIS — Z01818 Encounter for other preprocedural examination: Secondary | ICD-10-CM

## 2024-04-21 HISTORY — PX: HARDWARE REMOVAL: SHX979

## 2024-04-21 LAB — POCT PREGNANCY, URINE: Preg Test, Ur: NEGATIVE

## 2024-04-21 SURGERY — REMOVAL, HARDWARE
Anesthesia: General | Site: Foot | Laterality: Right

## 2024-04-21 MED ORDER — CHLORHEXIDINE GLUCONATE 4 % EX SOLN: 60.0000 mL | Freq: Once | CUTANEOUS | Status: DC

## 2024-04-21 MED ORDER — SCOPOLAMINE 1 MG/3DAYS TD PT72
MEDICATED_PATCH | TRANSDERMAL | Status: AC
  Filled 2024-04-21: qty 1

## 2024-04-21 MED ORDER — BUPIVACAINE-EPINEPHRINE 0.5% -1:200000 IJ SOLN
INTRAMUSCULAR | Status: DC | PRN
  Administered 2024-04-21: 14:00:00 20 mL

## 2024-04-21 MED ORDER — FENTANYL CITRATE (PF) 100 MCG/2ML IJ SOLN: 25.0000 ug | INTRAMUSCULAR | Status: DC | PRN

## 2024-04-21 MED ORDER — KETOROLAC TROMETHAMINE 30 MG/ML IJ SOLN
INTRAMUSCULAR | Status: AC
  Filled 2024-04-21: qty 1

## 2024-04-21 MED ORDER — ACETAMINOPHEN 500 MG PO TABS
ORAL_TABLET | ORAL | Status: AC
  Filled 2024-04-21: qty 2

## 2024-04-21 MED ORDER — LIDOCAINE 2% (20 MG/ML) 5 ML SYRINGE
INTRAMUSCULAR | Status: AC
  Filled 2024-04-21: qty 5

## 2024-04-21 MED ORDER — DROPERIDOL 2.5 MG/ML IJ SOLN: 0.6250 mg | Freq: Once | INTRAMUSCULAR | Status: DC | PRN

## 2024-04-21 MED ORDER — FENTANYL CITRATE (PF) 100 MCG/2ML IJ SOLN
INTRAMUSCULAR | Status: DC | PRN
  Administered 2024-04-21 (×2): 50 ug via INTRAVENOUS

## 2024-04-21 MED ORDER — LIDOCAINE HCL (CARDIAC) PF 100 MG/5ML IV SOSY
PREFILLED_SYRINGE | INTRAVENOUS | Status: DC | PRN
  Administered 2024-04-21: 13:00:00 60 mg via INTRAVENOUS

## 2024-04-21 MED ORDER — ONDANSETRON HCL 4 MG/2ML IJ SOLN
INTRAMUSCULAR | Status: DC | PRN
  Administered 2024-04-21: 14:00:00 4 mg via INTRAVENOUS

## 2024-04-21 MED ORDER — ONDANSETRON HCL 4 MG/2ML IJ SOLN
INTRAMUSCULAR | Status: AC
  Filled 2024-04-21: qty 2

## 2024-04-21 MED ORDER — OXYCODONE HCL 5 MG PO TABS: 5.0000 mg | ORAL_TABLET | Freq: Once | ORAL | Status: DC | PRN

## 2024-04-21 MED ORDER — 0.9 % SODIUM CHLORIDE (POUR BTL) OPTIME
TOPICAL | Status: DC | PRN
  Administered 2024-04-21: 14:00:00 1000 mL

## 2024-04-21 MED ORDER — PROPOFOL 10 MG/ML IV BOLUS
INTRAVENOUS | Status: AC
  Filled 2024-04-21: qty 20

## 2024-04-21 MED ORDER — ACETAMINOPHEN 500 MG PO TABS
1000.0000 mg | ORAL_TABLET | Freq: Once | ORAL | Status: AC
  Administered 2024-04-21: 13:00:00 1000 mg via ORAL

## 2024-04-21 MED ORDER — OXYCODONE HCL 5 MG PO TABS: 5.0000 mg | ORAL_TABLET | ORAL | 0 refills | Status: AC | PRN

## 2024-04-21 MED ORDER — KETOROLAC TROMETHAMINE 30 MG/ML IJ SOLN
INTRAMUSCULAR | Status: DC | PRN
  Administered 2024-04-21: 14:00:00 30 mg via INTRAVENOUS

## 2024-04-21 MED ORDER — CEFAZOLIN SODIUM-DEXTROSE 2-4 GM/100ML-% IV SOLN
INTRAVENOUS | Status: AC
  Filled 2024-04-21: qty 100

## 2024-04-21 MED ORDER — MIDAZOLAM HCL 5 MG/5ML IJ SOLN
INTRAMUSCULAR | Status: DC | PRN
  Administered 2024-04-21: 13:00:00 2 mg via INTRAVENOUS

## 2024-04-21 MED ORDER — SCOPOLAMINE 1 MG/3DAYS TD PT72
1.0000 | MEDICATED_PATCH | TRANSDERMAL | Status: DC
  Administered 2024-04-21: 13:00:00 1 mg via TRANSDERMAL

## 2024-04-21 MED ORDER — CEFAZOLIN SODIUM-DEXTROSE 2-4 GM/100ML-% IV SOLN
2.0000 g | INTRAVENOUS | Status: AC
  Administered 2024-04-21: 13:00:00 2 g via INTRAVENOUS

## 2024-04-21 MED ORDER — FENTANYL CITRATE (PF) 100 MCG/2ML IJ SOLN
INTRAMUSCULAR | Status: AC
  Filled 2024-04-21: qty 2

## 2024-04-21 MED ORDER — DEXAMETHASONE SOD PHOSPHATE PF 10 MG/ML IJ SOLN
INTRAMUSCULAR | Status: DC | PRN
  Administered 2024-04-21: 14:00:00 5 mg via INTRAVENOUS

## 2024-04-21 MED ORDER — PROPOFOL 10 MG/ML IV BOLUS
INTRAVENOUS | Status: DC | PRN
  Administered 2024-04-21: 13:00:00 150 mg via INTRAVENOUS

## 2024-04-21 MED ORDER — ASPIRIN 81 MG PO TBEC: 81.0000 mg | DELAYED_RELEASE_TABLET | Freq: Two times a day (BID) | ORAL | 0 refills | Status: AC

## 2024-04-21 MED ORDER — OXYCODONE HCL 5 MG/5ML PO SOLN: 5.0000 mg | Freq: Once | ORAL | Status: DC | PRN

## 2024-04-21 MED ORDER — MIDAZOLAM HCL 2 MG/2ML IJ SOLN
INTRAMUSCULAR | Status: AC
  Filled 2024-04-21: qty 2

## 2024-04-21 MED ORDER — LACTATED RINGERS IV SOLN: INTRAVENOUS | Status: DC

## 2024-04-21 MED ORDER — DOCUSATE SODIUM 100 MG PO CAPS: 100.0000 mg | ORAL_CAPSULE | Freq: Two times a day (BID) | ORAL | 0 refills | Status: AC

## 2024-04-21 SURGICAL SUPPLY — 45 items
BLADE SURG 15 STRL LF DISP TIS (BLADE) ×8 IMPLANT
BNDG ELASTIC 4INX 5YD STR LF (GAUZE/BANDAGES/DRESSINGS) ×2 IMPLANT
BNDG ELASTIC 6INX 5YD STR LF (GAUZE/BANDAGES/DRESSINGS) IMPLANT
BNDG GAUZE DERMACEA FLUFF 4 (GAUZE/BANDAGES/DRESSINGS) IMPLANT
BNDG STRETCH GAUZE 3IN X12FT (GAUZE/BANDAGES/DRESSINGS) ×2 IMPLANT
CANISTER SUCT 1200ML W/VALVE (MISCELLANEOUS) ×2 IMPLANT
CHLORAPREP W/TINT 26 (MISCELLANEOUS) ×2 IMPLANT
COVER BACK TABLE 60X90IN (DRAPES) ×2 IMPLANT
CUFF TRNQT CYL 34X4.125X (TOURNIQUET CUFF) IMPLANT
DRAPE C-ARM 42X72 X-RAY (DRAPES) IMPLANT
DRAPE C-ARMOR (DRAPES) IMPLANT
DRAPE EXTREMITY T 121X128X90 (DISPOSABLE) ×2 IMPLANT
DRAPE IMP U-DRAPE 54X76 (DRAPES) ×2 IMPLANT
DRAPE OEC MINIVIEW 54X84 (DRAPES) IMPLANT
DRAPE U-SHAPE 47X51 STRL (DRAPES) ×2 IMPLANT
DRSG MEPILEX POST OP 4X8 (GAUZE/BANDAGES/DRESSINGS) IMPLANT
DRSG MEPITEL 4X7.2 (GAUZE/BANDAGES/DRESSINGS) ×2 IMPLANT
ELECTRODE REM PT RTRN 9FT ADLT (ELECTROSURGICAL) ×2 IMPLANT
FIXATION ZIPTIGHT ANKLE SNDSMS (Ankle) IMPLANT
GAUZE SPONGE 4X4 12PLY STRL (GAUZE/BANDAGES/DRESSINGS) ×2 IMPLANT
GLOVE BIOGEL PI IND STRL 8 (GLOVE) ×2 IMPLANT
GLOVE SURG SS PI 7.5 STRL IVOR (GLOVE) ×2 IMPLANT
GOWN STRL REUS W/ TWL LRG LVL3 (GOWN DISPOSABLE) ×4 IMPLANT
NDL HYPO 25X1 1.5 SAFETY (NEEDLE) IMPLANT
NEEDLE HYPO 25X1 1.5 SAFETY (NEEDLE) IMPLANT
PACK BASIN DAY SURGERY FS (CUSTOM PROCEDURE TRAY) ×2 IMPLANT
PADDING CAST SYNTHETIC 4X4 STR (CAST SUPPLIES) IMPLANT
PENCIL SMOKE EVACUATOR (MISCELLANEOUS) IMPLANT
SHEET MEDIUM DRAPE 40X70 STRL (DRAPES) ×2 IMPLANT
SLEEVE SCD COMPRESS KNEE MED (STOCKING) ×2 IMPLANT
SOLN 0.9% NACL POUR BTL 1000ML (IV SOLUTION) ×2 IMPLANT
SPIKE FLUID TRANSFER (MISCELLANEOUS) IMPLANT
SPONGE T-LAP 18X18 ~~LOC~~+RFID (SPONGE) ×2 IMPLANT
STAPLER SKIN PROX WIDE 3.9 (STAPLE) IMPLANT
SUCTION TUBE FRAZIER 10FR DISP (SUCTIONS) IMPLANT
SUT ETHILON 2 0 FS 18 (SUTURE) ×4 IMPLANT
SUT MNCRL AB 3-0 PS2 18 (SUTURE) IMPLANT
SUT VIC AB 0 CT1 27XBRD ANBCTR (SUTURE) IMPLANT
SUT VIC AB 2-0 CT1 TAPERPNT 27 (SUTURE) ×2 IMPLANT
SUT VIC AB 3-0 SH 27X BRD (SUTURE) IMPLANT
SYR BULB IRRIG 60ML STRL (SYRINGE) ×2 IMPLANT
SYR CONTROL 10ML LL (SYRINGE) IMPLANT
TOWEL GREEN STERILE FF (TOWEL DISPOSABLE) ×4 IMPLANT
TUBE CONNECTING 20X1/4 (TUBING) IMPLANT
UNDERPAD 30X36 HEAVY ABSORB (UNDERPADS AND DIAPERS) ×2 IMPLANT

## 2024-04-21 NOTE — Anesthesia Postprocedure Evaluation (Signed)
 Anesthesia Post Note  Patient: Patricia Bowman  Procedure(s) Performed: REMOVAL, HARDWARE DEEP ANKLE (Right: Foot)     Patient location during evaluation: PACU Anesthesia Type: General Level of consciousness: awake and alert Pain management: pain level controlled Vital Signs Assessment: post-procedure vital signs reviewed and stable Respiratory status: spontaneous breathing, nonlabored ventilation and respiratory function stable Cardiovascular status: blood pressure returned to baseline Postop Assessment: no apparent nausea or vomiting Anesthetic complications: no   No notable events documented.  Last Vitals:  Vitals:   04/21/24 1415 04/21/24 1422  BP: 107/72 109/66  Pulse: 70 71  Resp: 14 16  Temp:    SpO2: 100% 96%    Last Pain:  Vitals:   04/21/24 1422  TempSrc:   PainSc: 4         RLE Motor Response: Purposeful movement;Responds to commands (wiggles toes) (04/21/24 1422) RLE Sensation: Full sensation (at baseline) (04/21/24 1422)      Vertell Row

## 2024-04-21 NOTE — H&P (Signed)
 H&P Update:  -History and Physical Reviewed  -Patient has been re-examined  -No change in the plan of care  -The risks and benefits were presented and reviewed. The risks due to hardware/suture failure and/or irritation (if removing hardware: inability to remove part/all of hardware, recurrent instability), new/persistent infection, stiffness, nerve/vessel/tendon injury or rerupture of repaired tendon, nonunion/malunion, allograft usage, wound healing issues, development of arthritis, failure of this surgery, possibility of external fixation with delayed definitive surgery, need for further surgery, thromboembolic events, anesthesia/medical complications, amputation, death among others were discussed. The patient acknowledged the explanation, agreed to proceed with the plan and a consent was signed.  Lillia Mountain

## 2024-04-21 NOTE — Op Note (Signed)
 04/21/2024  2:22 PM   PATIENT: Patricia Bowman  23 y.o. female  MRN: [id number]   PRE-OPERATIVE DIAGNOSIS:   Right ankle symptomatic orthopaedic hardware   POST-OPERATIVE DIAGNOSIS:   Same   PROCEDURE: 1] Right ankle removal of deep hardware (syndesmosis fixation) 2] Right ankle revision syndesmosis ORIF   SURGEON:  Lillia Mountain, MD   ASSISTANT: None   ANESTHESIA: General, regional   EBL: Minimal   TOURNIQUET:   12 min   COMPLICATIONS: None apparent   DISPOSITION: Extubated, awake and stable to recovery.   INDICATION FOR PROCEDURE: The patient presented with above diagnosis.  We discussed the diagnosis, alternative treatment options, risks and benefits of the above surgical intervention, as well as alternative non-operative treatments. All questions/concerns were addressed and the patient/family demonstrated appropriate understanding of the diagnosis, the procedure, the postoperative course, and overall prognosis. The patient wished to proceed with surgical intervention and signed an informed surgical consent as such, in each others presence prior to surgery.   PROCEDURE IN DETAIL: After preoperative consent was obtained and the correct operative site was identified, the patient was brought to the operating room supine on stretcher. General anesthesia was induced. Preoperative antibiotics were administered. Surgical timeout was taken. The patient was then positioned supine. The operative lower extremity was prepped and draped in standard sterile fashion with a tourniquet around the thigh. The extremity was exsanguinated and the tourniquet was inflated to 275 mmHg.  Prior standard lateral incision was reused over the distal fibula. Dissection was carried down to the level of the plate and the screw heads identified. The superficial peroneal nerve was identified and protected throughout the procedure. The screw was removed completely.  Next, a suture fixation  system (ZipTight device) was implanted through the fibula plate in cannulated fashion to fix the syndesmosis. Anchor/button position was verified along anteromedial tibial cortex by fluoroscopy. A repeat stress radiograph showed complete stability of the ankle mortise to testing.   The surgical sites were thoroughly irrigated. The tourniquet was deflated and hemostasis achieved. The deep layers were closed using 2-0 vicryl. The skin was closed without tension.    The leg was cleaned with saline and sterile dressings with gauze were applied. Well padded bulky dressings and wrap were applied. The patient was awakened from anesthesia and transported to the recovery room in stable condition.    FOLLOW UP PLAN: -transfer to PACU, then home -WBAT operative extremity, maximum elevation -maintain dry dressings until follow up -DVT ppx: Aspirin  81 mg twice daily -follow up as outpatient within 7-10 days for wound check -sutures out in 2-3 weeks in outpatient office   RADIOGRAPHS: AP, lateral, oblique and stress radiographs of the right ankle were obtained intraoperatively. These showed interval reduction and fixation of the fractures. Manual stress radiographs were taken and the joints were noted to be stable following procedure. All hardware is appropriately positioned and of the appropriate lengths. No other acute injuries are noted.   Lillia Mountain [facility]

## 2024-04-21 NOTE — Transfer of Care (Signed)
 Immediate Anesthesia Transfer of Care Note  Patient: Patricia Bowman  Procedure(s) Performed: REMOVAL, HARDWARE DEEP ANKLE (Right: Foot)  Patient Location: PACU  Anesthesia Type:General  Level of Consciousness: awake, alert , and oriented  Airway & Oxygen Therapy: Patient Spontanous Breathing and Patient connected to nasal cannula oxygen  Post-op Assessment: Report given to RN and Post -op Vital signs reviewed and stable  Post vital signs: Reviewed and stable  Last Vitals:  Vitals Value Taken Time  BP 106/77 04/21/24 14:08  Temp 36.8 C 04/21/24 14:08  Pulse 74 04/21/24 14:09  Resp 11 04/21/24 14:09  SpO2 100 % 04/21/24 14:09  Vitals shown include unfiled device data.  Last Pain:  Vitals:   04/21/24 1246  TempSrc: Tympanic  PainSc: 3       Patients Stated Pain Goal: 7 (04/21/24 1246)  Complications: No notable events documented.

## 2024-04-21 NOTE — Anesthesia Procedure Notes (Signed)
 Procedure Name: LMA Insertion Date/Time: 04/21/2024 1:30 PM  Performed by: Pam Macario BROCKS, CRNAPre-anesthesia Checklist: Patient identified, Emergency Drugs available, Suction available, Patient being monitored and Timeout performed Patient Re-evaluated:Patient Re-evaluated prior to induction Oxygen Delivery Method: Circle system utilized Preoxygenation: Pre-oxygenation with 100% oxygen Induction Type: IV induction Ventilation: Mask ventilation without difficulty LMA: LMA inserted LMA Size: 4.0 Number of attempts: 1 Airway Equipment and Method: Bite block Placement Confirmation: positive ETCO2, breath sounds checked- equal and bilateral and CO2 detector Tube secured with: Tape Dental Injury: Teeth and Oropharynx as per pre-operative assessment

## 2024-04-21 NOTE — Anesthesia Preprocedure Evaluation (Addendum)
 Anesthesia Evaluation  Patient identified by MRN, date of birth, ID band Patient awake    Reviewed: Allergy & Precautions, NPO status , Patient's Chart, lab work & pertinent test results  History of Anesthesia Complications Negative for: history of anesthetic complications  Airway Mallampati: I  TM Distance: >3 FB Neck ROM: Full    Dental no notable dental hx.    Pulmonary neg pulmonary ROS   Pulmonary exam normal        Cardiovascular negative cardio ROS Normal cardiovascular exam     Neuro/Psych    Depression    negative neurological ROS     GI/Hepatic negative GI ROS, Neg liver ROS,,,  Endo/Other  negative endocrine ROS    Renal/GU negative Renal ROS     Musculoskeletal Closed fracture of lateral malleolus of right fibula   Abdominal   Peds  Hematology negative hematology ROS (+)   Anesthesia Other Findings   Reproductive/Obstetrics                              Anesthesia Physical Anesthesia Plan  ASA: 1  Anesthesia Plan: General   Post-op Pain Management: Tylenol  PO (pre-op)* and Toradol  IV (intra-op)*   Induction: Intravenous  PONV Risk Score and Plan: 3 and Treatment may vary due to age or medical condition, Ondansetron , Dexamethasone , Midazolam  and Scopolamine  patch - Pre-op  Airway Management Planned: LMA  Additional Equipment: None  Intra-op Plan:   Post-operative Plan: Extubation in OR  Informed Consent: I have reviewed the patients History and Physical, chart, labs and discussed the procedure including the risks, benefits and alternatives for the proposed anesthesia with the patient or authorized representative who has indicated his/her understanding and acceptance.     Dental advisory given  Plan Discussed with: CRNA  Anesthesia Plan Comments:          Anesthesia Quick Evaluation

## 2024-04-22 ENCOUNTER — Encounter (HOSPITAL_BASED_OUTPATIENT_CLINIC_OR_DEPARTMENT_OTHER): Payer: Self-pay | Admitting: Orthopaedic Surgery
